# Patient Record
Sex: Male | Born: 2000 | Race: Black or African American | Hispanic: No | Marital: Single
Health system: Southern US, Community
[De-identification: ages and names within clinical notes are randomized; demographics above are authoritative.]

---
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
## 2022-11-16 ENCOUNTER — Emergency Department (HOSPITAL_COMMUNITY)
Admission: EM | Admit: 2022-11-16 | Discharge: 2022-11-16 | Disposition: A | Discharge status: Home / Self Care | Payer: No Typology Code available for payment source | Attending: Emergency Medicine | Admitting: Emergency Medicine

## 2022-11-16 DIAGNOSIS — Y92410 Unspecified street and highway as the place of occurrence of the external cause: Secondary | ICD-10-CM | POA: Diagnosis not present

## 2022-11-16 DIAGNOSIS — S161XXA Strain of muscle, fascia and tendon at neck level, initial encounter: Secondary | ICD-10-CM | POA: Insufficient documentation

## 2022-11-16 DIAGNOSIS — M545 Low back pain, unspecified: Secondary | ICD-10-CM | POA: Insufficient documentation

## 2022-11-16 DIAGNOSIS — S199XXA Unspecified injury of neck, initial encounter: Secondary | ICD-10-CM | POA: Diagnosis present

## 2022-11-16 MED ORDER — IBUPROFEN 200 MG PO TABS
600.0000 mg | ORAL_TABLET | Freq: Once | ORAL | Status: AC
  Administered 2022-11-16: 600 mg via ORAL
  Filled 2022-11-16: qty 1

## 2022-11-16 MED ORDER — METHOCARBAMOL 500 MG PO TABS: 500.0000 mg | ORAL_TABLET | Freq: Two times a day (BID) | ORAL | 0 refills | Status: AC | PRN

## 2022-11-16 MED ORDER — IBUPROFEN 600 MG PO TABS: 600.0000 mg | ORAL_TABLET | Freq: Four times a day (QID) | ORAL | 0 refills | Status: AC | PRN

## 2022-11-16 MED ORDER — ACETAMINOPHEN 325 MG PO TABS
650.0000 mg | ORAL_TABLET | Freq: Once | ORAL | Status: AC
  Administered 2022-11-16: 650 mg via ORAL
  Filled 2022-11-16: qty 2

## 2022-11-16 MED ORDER — LIDOCAINE 5 % EX PTCH
1.0000 | MEDICATED_PATCH | CUTANEOUS | Status: DC
  Administered 2022-11-16: 1 via TRANSDERMAL
  Filled 2022-11-16: qty 1
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
